# Patient Record
Sex: Male | Born: 1985 | Race: White | Hispanic: No | Marital: Single | Smoking: Never smoker
Health system: Southern US, Community
[De-identification: ages and names within clinical notes are randomized; demographics above are authoritative.]

## PROBLEM LIST (undated history)

## (undated) ENCOUNTER — Emergency Department (HOSPITAL_COMMUNITY): Discharge status: Against Medical Advice | Payer: Self-pay

---

## 2009-05-24 ENCOUNTER — Emergency Department (HOSPITAL_COMMUNITY): Admission: EM | Admit: 2009-05-24 | Discharge: 2009-05-24 | Payer: Self-pay | Admitting: Emergency Medicine

## 2011-12-20 ENCOUNTER — Emergency Department (INDEPENDENT_AMBULATORY_CARE_PROVIDER_SITE_OTHER)
Admission: EM | Admit: 2011-12-20 | Discharge: 2011-12-20 | Disposition: A | Discharge status: Home / Self Care | Payer: Worker's Compensation | Source: Home / Self Care | Attending: Emergency Medicine | Admitting: Emergency Medicine

## 2011-12-20 ENCOUNTER — Encounter (HOSPITAL_COMMUNITY): Payer: Self-pay

## 2011-12-20 ENCOUNTER — Emergency Department (INDEPENDENT_AMBULATORY_CARE_PROVIDER_SITE_OTHER): Payer: Worker's Compensation

## 2011-12-20 DIAGNOSIS — S4350XA Sprain of unspecified acromioclavicular joint, initial encounter: Secondary | ICD-10-CM

## 2011-12-20 DIAGNOSIS — S60219A Contusion of unspecified wrist, initial encounter: Secondary | ICD-10-CM

## 2011-12-20 DIAGNOSIS — S60211A Contusion of right wrist, initial encounter: Secondary | ICD-10-CM

## 2011-12-20 MED ORDER — HYDROCODONE-IBUPROFEN 7.5-200 MG PO TABS: 1.0000 | ORAL_TABLET | Freq: Three times a day (TID) | ORAL | Status: AC | PRN
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
## 2013-10-05 ENCOUNTER — Emergency Department (INDEPENDENT_AMBULATORY_CARE_PROVIDER_SITE_OTHER)
Admission: EM | Admit: 2013-10-05 | Discharge: 2013-10-05 | Disposition: A | Discharge status: Home / Self Care | Payer: PRIVATE HEALTH INSURANCE | Source: Home / Self Care | Attending: Family Medicine | Admitting: Family Medicine

## 2013-10-05 ENCOUNTER — Encounter (HOSPITAL_COMMUNITY): Payer: Self-pay | Admitting: Emergency Medicine

## 2013-10-05 ENCOUNTER — Emergency Department (INDEPENDENT_AMBULATORY_CARE_PROVIDER_SITE_OTHER): Payer: PRIVATE HEALTH INSURANCE

## 2013-10-05 DIAGNOSIS — S93402A Sprain of unspecified ligament of left ankle, initial encounter: Secondary | ICD-10-CM

## 2013-10-05 DIAGNOSIS — Y9302 Activity, running: Secondary | ICD-10-CM

## 2013-10-05 DIAGNOSIS — S93409A Sprain of unspecified ligament of unspecified ankle, initial encounter: Secondary | ICD-10-CM

## 2013-10-05 DIAGNOSIS — S93609A Unspecified sprain of unspecified foot, initial encounter: Secondary | ICD-10-CM

## 2013-10-05 DIAGNOSIS — X500XXA Overexertion from strenuous movement or load, initial encounter: Secondary | ICD-10-CM

## 2013-10-05 DIAGNOSIS — S93602A Unspecified sprain of left foot, initial encounter: Secondary | ICD-10-CM

## 2013-10-05 NOTE — ED Provider Notes (Signed)
CSN: [id number]     Arrival date & time 10/05/13  0913 History   First MD Initiated Contact with Patient 10/05/13 0940     Chief Complaint  Patient presents with  . Ankle Injury   Patient is a 28 y.o. male presenting with lower extremity injury.  Ankle Injury This is a new problem. The current episode started 12 to 24 hours ago. The problem has been gradually worsening. The symptoms are aggravated by walking. Nothing relieves the symptoms. He has tried a cold compress for the symptoms. The treatment provided no relief.  Pt reports he "rolled" his ankle last night while running from a paved surface to a grassy surface. Reports persist pain to the (L) dorsal region of his (L) foot. Pain worse w/ weight bearing.   History reviewed. No pertinent past medical history. History reviewed. No pertinent past surgical history. History reviewed. No pertinent family history. History  Substance Use Topics  . Smoking status: Never Smoker   . Smokeless tobacco: Not on file  . Alcohol Use: Yes    Review of Systems  All other systems reviewed and are negative.   Allergies  Review of patient's allergies indicates no known allergies.  Home Medications   Prior to Admission medications   Not on File   BP 133/94  Pulse 89  Temp(Src) 98.4 F (36.9 C) (Oral)  Resp 20  SpO2 98% Physical Exam  Nursing note and vitals reviewed. Constitutional: He is oriented to person, place, and time. He appears well-developed and well-nourished.  HENT:  Head: Normocephalic and atraumatic.  Eyes: Conjunctivae are normal.  Cardiovascular: Normal rate.   Pulmonary/Chest: Effort normal.  Musculoskeletal:       Feet:  Neurological: He is alert and oriented to person, place, and time.  Skin: Skin is warm and dry.  Psychiatric: He has a normal mood and affect.    ED Course  Procedures (including critical care time) Labs Review Labs Reviewed - No data to display  Imaging Review Dg Foot Complete  Left  10/05/2013   CLINICAL DATA:  History injury to foot/ankle 1 day ago. Pain over lateral tarsals.  EXAM: LEFT FOOT - COMPLETE 3+ VIEW  COMPARISON:  None.  FINDINGS: No acute fracture or dislocation. Presumed accessory ossicle about the medial aspect of the first metatarsal head on the oblique image.  IMPRESSION: No acute osseous abnormality.   Electronically Signed   By: Jeronimo Greaves M.D.   On: 10/05/2013 10:17     MDM   1. Left ankle sprain   2. Sprain of foot, left    Imaging neg for fx. Will treat for sprain.  Lace up ankle support applied. Ibuprofen for pain. RICE instructions discussed and provided in print.   Ortho f/u if not improving.  Leanne Chang, NP 10/05/13 [phone]

## 2013-10-05 NOTE — ED Provider Notes (Signed)
Medical screening examination/treatment/procedure(s) were performed by a resident physician or non-physician practitioner and as the supervising physician I was immediately available for consultation/collaboration.  Clementeen Graham, MD   Rodolph Bong, MD 10/05/13 [phone]

## 2013-10-05 NOTE — Discharge Instructions (Signed)
Your xrays today are negative for broken bones. Take Ibuprofen 800 mg three times a day for 3-5 days. Wear the wrap until pain improves or resolves. If your symptoms persist please arrange follow up with Dr Eulah Pont for further evaluation.  Ankle Sprain An ankle sprain is an injury to the strong, fibrous tissues (ligaments) that hold your ankle bones together.  HOME CARE   Put ice on your ankle for 1 2 days or as told by your doctor.  Put ice in a plastic bag.  Place a towel between your skin and the bag.  Leave the ice on for 15-20 minutes at a time, every 2 hours while you are awake.  Only take medicine as told by your doctor.  Raise (elevate) your injured ankle above the level of your heart as much as possible for 2 3 days.  Use crutches if your doctor tells you to. Slowly put your own weight on the affected ankle. Use the crutches until you can walk without pain.  If you have a plaster splint:  Do not rest it on anything harder than a pillow for 24 hours.  Do not put weight on it.  Do not get it wet.  Take it off to shower or bathe.  If given, use an elastic wrap or support stocking for support. Take the wrap off if your toes lose feeling (numb), tingle, or turn cold or blue.  If you have an air splint:  Add or let out air to make it comfortable.  Take it off at night and to shower and bathe.  Wiggle your toes and move your ankle up and down often while you are wearing it. GET HELP RIGHT AWAY IF:   Your toes lose feeling (numb) or turn blue.  You have severe pain that is increasing.  You have rapidly increasing bruising or puffiness (swelling).  Your toes feel very cold.  You lose feeling in your foot.  Your medicine does not help your pain. MAKE SURE YOU:   Understand these instructions.  Will watch your condition.  Will get help right away if you are not doing well or get worse. Document Released: 10/04/2007 Document Revised: 01/10/2012 Document  Reviewed: 10/30/2011 [facility] Patient Information 2014 [place], [place].  Foot Sprain The muscles and cord like structures which attach muscle to bone (tendons) that surround the feet are made up of units. A foot sprain can occur at the weakest spot in any of these units. This condition is most often caused by injury to or overuse of the foot, as from playing contact sports, or aggravating a previous injury, or from poor conditioning, or obesity. SYMPTOMS  Pain with movement of the foot.  Tenderness and swelling at the injury site.  Loss of strength is present in moderate or severe sprains. THE THREE GRADES OR SEVERITY OF FOOT SPRAIN ARE:  Mild (Grade I): Slightly pulled muscle without tearing of muscle or tendon fibers or loss of strength.  Moderate (Grade II): Tearing of fibers in a muscle, tendon, or at the attachment to bone, with small decrease in strength.  Severe (Grade III): Rupture of the muscle-tendon-bone attachment, with separation of fibers. Severe sprain requires surgical repair. Often repeating (chronic) sprains are caused by overuse. Sudden (acute) sprains are caused by direct injury or over-use. DIAGNOSIS  Diagnosis of this condition is usually by your own observation. If problems continue, a caregiver may be required for further evaluation and treatment. X-rays may be required to make sure there are not  breaks in the bones (fractures) present. Continued problems may require physical therapy for treatment. PREVENTION  Use strength and conditioning exercises appropriate for your sport.  Warm up properly prior to working out.  Use athletic shoes that are made for the sport you are participating in.  Allow adequate time for healing. Early return to activities makes repeat injury more likely, and can lead to an unstable arthritic foot that can result in prolonged disability. Mild sprains generally heal in 3 to 10 days, with moderate and severe sprains taking 2 to 10 weeks.  Your caregiver can help you determine the proper time required for healing. HOME CARE INSTRUCTIONS   Apply ice to the injury for 15-20 minutes, 03-04 times per day. Put the ice in a plastic bag and place a towel between the bag of ice and your skin.  An elastic wrap (like an Ace bandage) may be used to keep swelling down.  Keep foot above the level of the heart, or at least raised on a footstool, when swelling and pain are present.  Try to avoid use other than gentle range of motion while the foot is painful. Do not resume use until instructed by your caregiver. Then begin use gradually, not increasing use to the point of pain. If pain does develop, decrease use and continue the above measures, gradually increasing activities that do not cause discomfort, until you gradually achieve normal use.  Use crutches if and as instructed, and for the length of time instructed.  Keep injured foot and ankle wrapped between treatments.  Massage foot and ankle for comfort and to keep swelling down. Massage from the toes up towards the knee.  Only take over-the-counter or prescription medicines for pain, discomfort, or fever as directed by your caregiver. SEEK IMMEDIATE MEDICAL CARE IF:   Your pain and swelling increase, or pain is not controlled with medications.  You have loss of feeling in your foot or your foot turns cold or blue.  You develop new, unexplained symptoms, or an increase of the symptoms that brought you to your caregiver. MAKE SURE YOU:   Understand these instructions.  Will watch your condition.  Will get help right away if you are not doing well or get worse. Document Released: 10/07/2001 Document Revised: 07/10/2011 Document Reviewed: 12/05/2007 [facility] Patient Information 2014 [place].

## 2013-10-05 NOTE — ED Notes (Signed)
Rolled his left ankle last PM when stepped onto an uneven surface while crossing the street. NAD. C/o pain worse today in spite of ice, elevate. Had taken no medication for this problem

## 2015-08-16 IMAGING — CR DG FOOT COMPLETE 3+V*L*
3 series · 3 of 3 positions shown · non-contrast
Comparison: None.

CLINICAL DATA: History injury to foot/ankle 1 day ago. Pain over
lateral tarsals.

EXAM:
LEFT FOOT - COMPLETE 3+ VIEW

[view not recorded (1 of 3)]
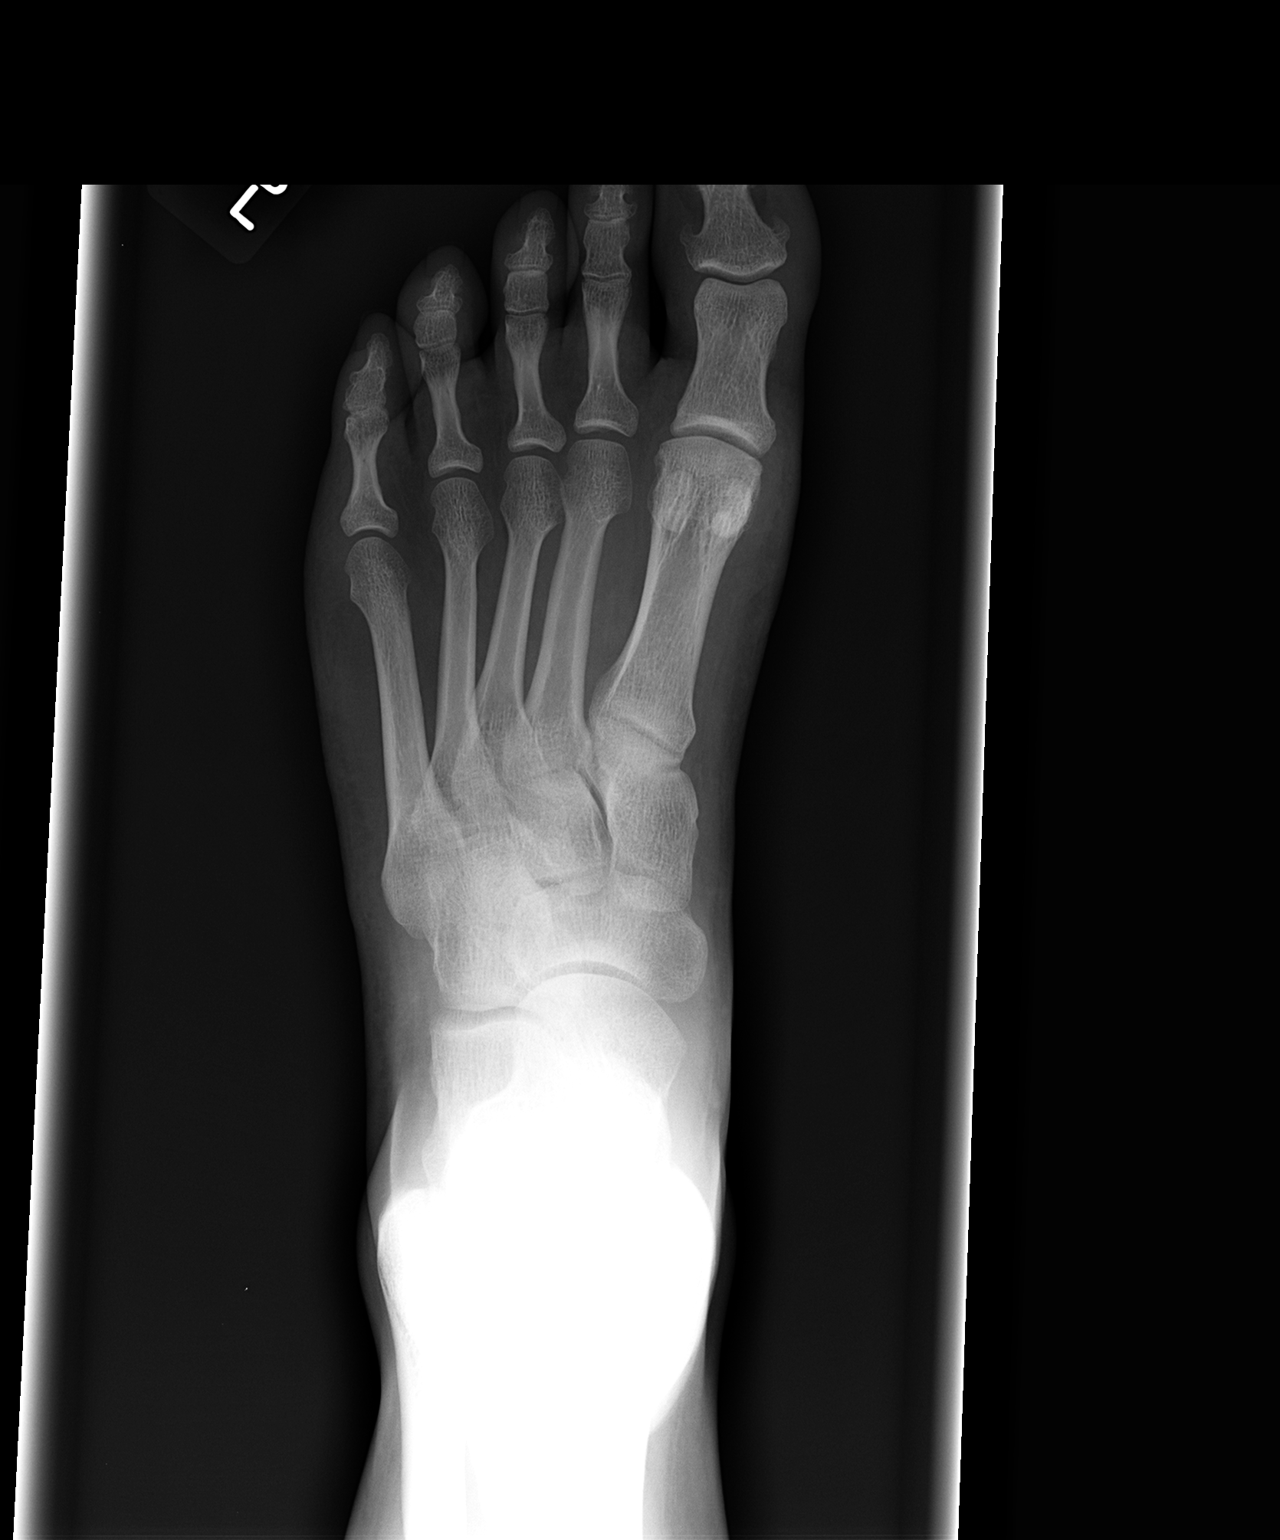

[view not recorded (2 of 3)]
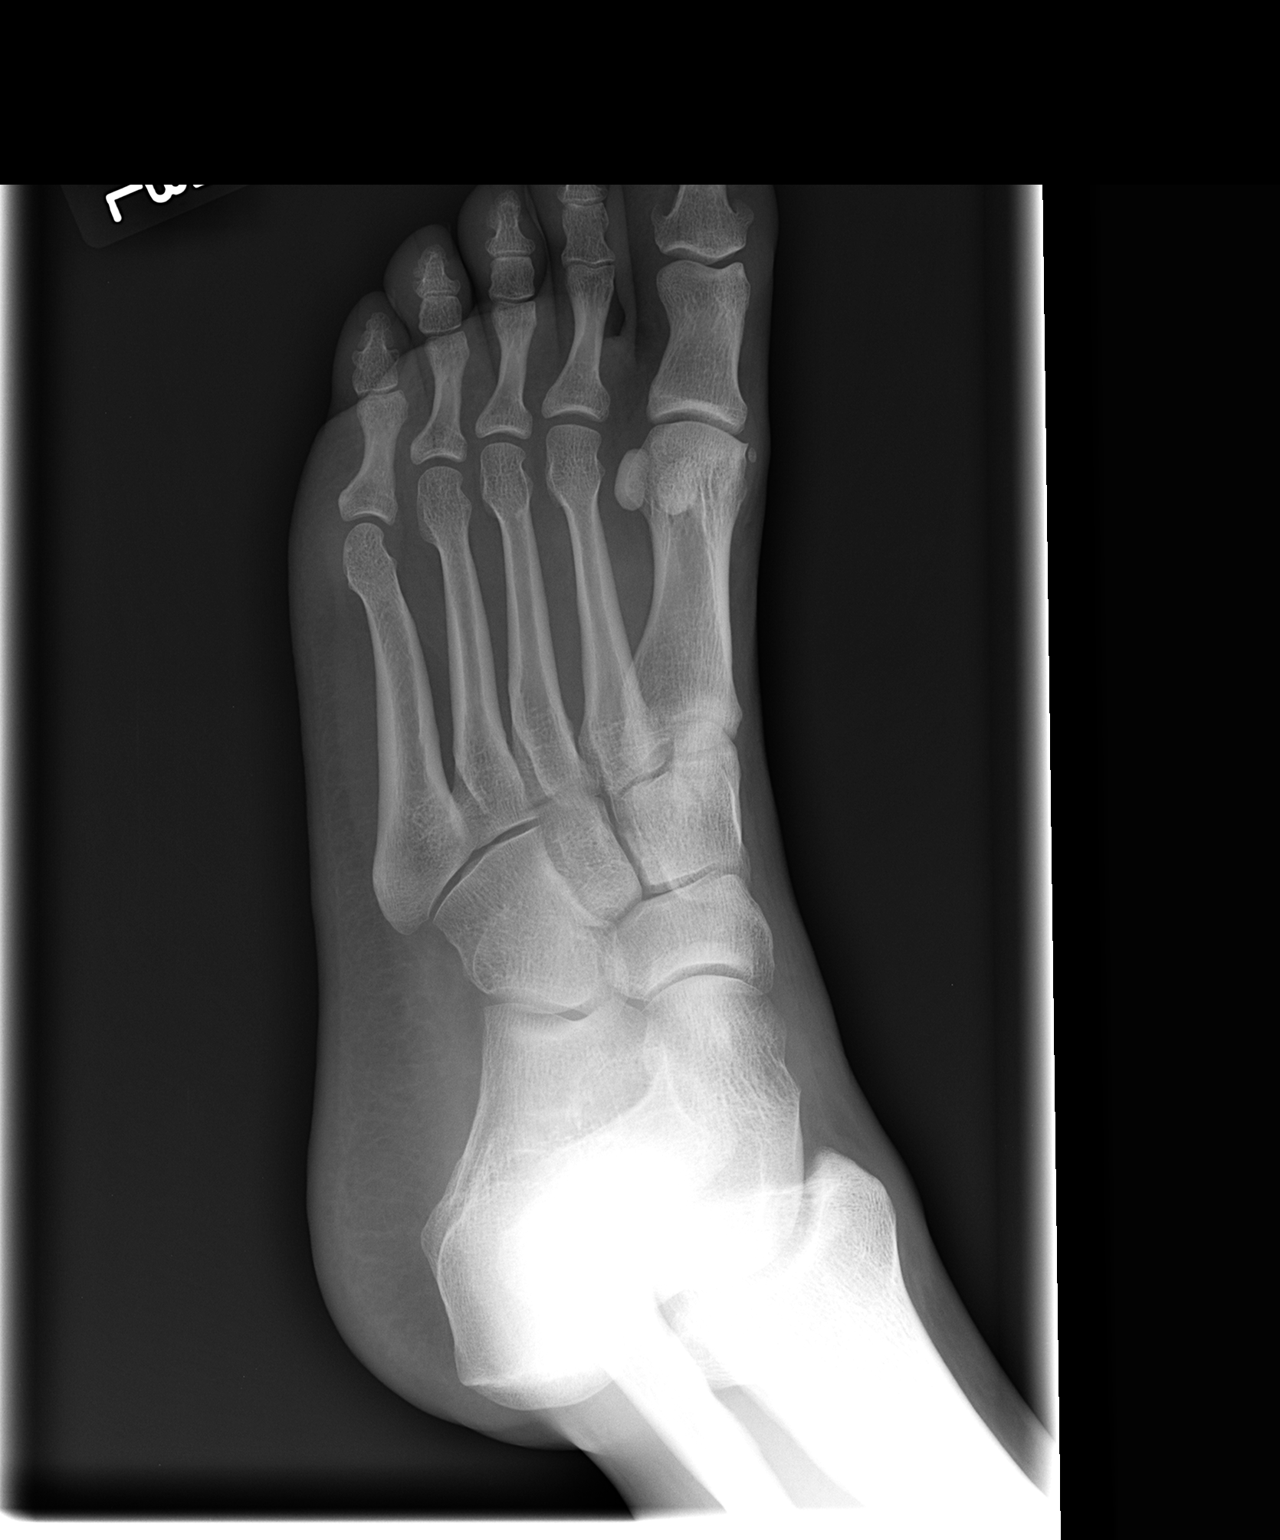

[view not recorded (3 of 3)]
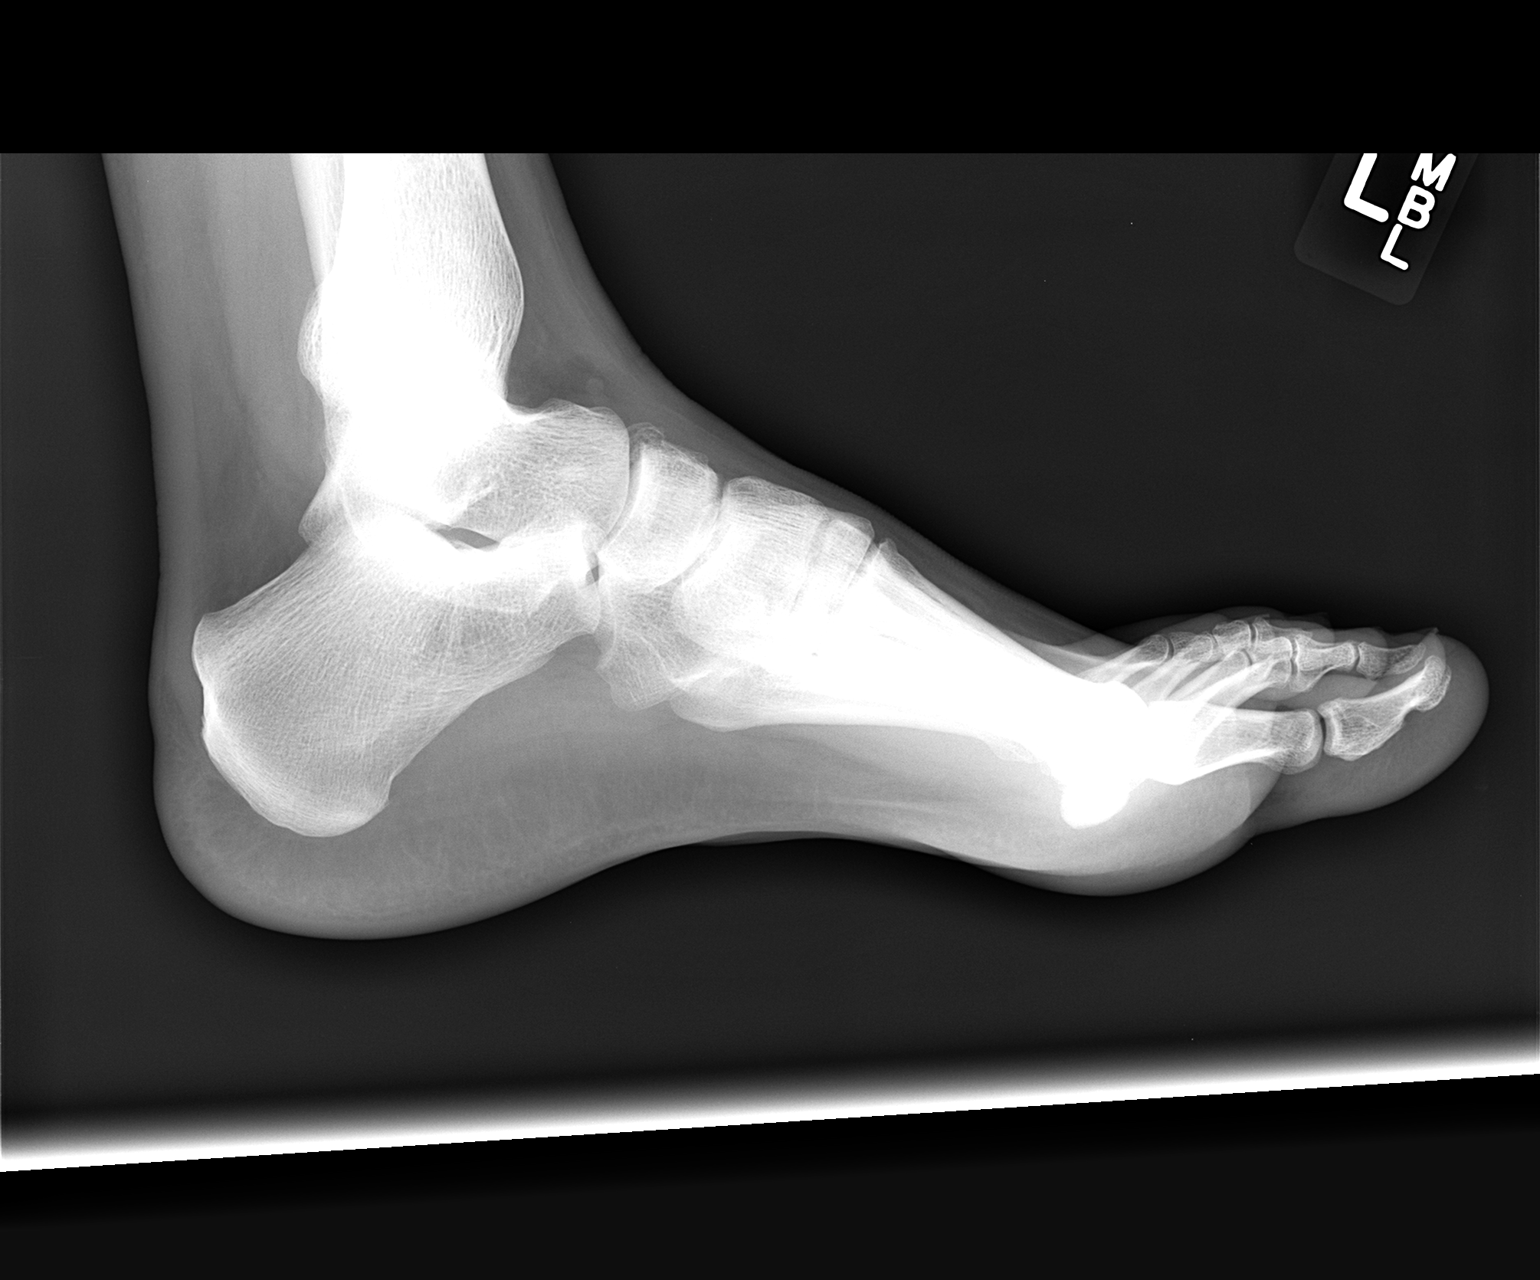

[3 of 3 positions shown; findings below may reference images not displayed]

FINDINGS: No acute fracture or dislocation. Presumed accessory ossicle about
the medial aspect of the first metatarsal head on the oblique image.
IMPRESSION: No acute osseous abnormality.

## 2015-08-22 ENCOUNTER — Ambulatory Visit (INDEPENDENT_AMBULATORY_CARE_PROVIDER_SITE_OTHER): Payer: PRIVATE HEALTH INSURANCE | Admitting: Internal Medicine

## 2015-08-22 VITALS — BP 122/80 | HR 71 | Temp 98.1°F | Resp 18 | Ht 73.0 in | Wt 218.0 lb

## 2015-08-22 DIAGNOSIS — L089 Local infection of the skin and subcutaneous tissue, unspecified: Secondary | ICD-10-CM

## 2015-08-22 DIAGNOSIS — H6012 Cellulitis of left external ear: Secondary | ICD-10-CM

## 2015-08-22 DIAGNOSIS — L0889 Other specified local infections of the skin and subcutaneous tissue: Secondary | ICD-10-CM

## 2015-08-22 MED ORDER — CLINDAMYCIN HCL 300 MG PO CAPS: 300.0000 mg | ORAL_CAPSULE | Freq: Three times a day (TID) | ORAL | Status: AC

## 2015-08-22 NOTE — Patient Instructions (Signed)
     IF you received an x-ray today, you will receive an invoice from [facility]. Please contact [facility] at [phone] with questions or concerns regarding your invoice.   IF you received labwork today, you will receive an invoice from [facility]. Please contact [facility] at [phone] with questions or concerns regarding your invoice.   Our billing staff will not be able to assist you with questions regarding bills from these companies.  You will be contacted with the lab results as soon as they are available. The fastest way to get your results is to activate your My Chart account. Instructions are located on the last page of this paperwork. If you have not heard from us regarding the results in 2 weeks, please contact this office.      

## 2015-08-22 NOTE — Progress Notes (Signed)
   Subjective:  By signing my name below, I, Stann Oresung-Kai Tsai, attest that this documentation has been prepared under the direction and in the presence of Ellamae Siaobert Gregorey Nabor, MD. Electronically Signed: Stann Oresung-Kai Tsai, Scribe. 08/22/2015 , 9:25 AM .  Patient was seen in Room 9 .   Patient ID: Kent PicketDavid Robotham, male    DOB: Jul 29, 1985, 30 y.o.   MRN: [id number] Chief Complaint  Patient presents with  . Ear Pain    left/x 2 days  . Jaw Pain    x 2 days/ left   HPI Kent Quinn is a 30 y.o. male who [facility] complaining of left ear pain and left jaw pain ongoing for about 5-6 days. He notes the pain worsened last night. He states he has seasonal allergies and would take claritin and zyrtec for it.   There are no active problems to display for this patient.   Current outpatient prescriptions:  .  clindamycin (CLEOCIN) 300 MG capsule, Take 1 capsule (300 mg total) by mouth 3 (three) times daily., Disp: 30 capsule, Rfl: 0 No Known Allergies  Review of Systems  Constitutional: Negative for fever, chills and fatigue.  HENT: Positive for ear pain. Negative for congestion, ear discharge, hearing loss, rhinorrhea, sneezing and sore throat.   Gastrointestinal: Negative for nausea and vomiting.  Allergic/Immunologic: Positive for environmental allergies.  Hematological: Negative for adenopathy.      Objective:   Physical Exam  Constitutional: He is oriented to person, place, and time. He appears well-developed and well-nourished. No distress.  HENT:  Head: Normocephalic and atraumatic.  Left Ear: Hearing normal.  Mouth/Throat: No oral lesions. No trismus in the jaw. No dental abscesses. No oropharyngeal exudate.  Slight fluid bulge left tm but no redness, 2cm into the canal is a pustule with redness and tenderness to touch, this reproduces the pain he has been feeling, no pain with tragus pull  Eyes: EOM are normal. Pupils are equal, round, and reactive to light.  Neck: Neck supple.    Cardiovascular: Normal rate.   Pulmonary/Chest: Effort normal. No respiratory distress.  Musculoskeletal: Normal range of motion.  Lymphadenopathy:       Head (left side): No submandibular, no preauricular and no posterior auricular adenopathy present.       Left cervical: No superficial cervical, no deep cervical and no posterior cervical adenopathy present.  Neurological: He is alert and oriented to person, place, and time.  Skin: Skin is warm and dry.  Psychiatric: He has a normal mood and affect. His behavior is normal.  Nursing note and vitals reviewed.  BP 122/80 mmHg  Pulse 71  Temp(Src) 98.1 F (36.7 C) (Oral)  Resp 18  Ht 6\' 1"  (1.854 m)  Wt 218 lb (98.884 kg)  BMI 28.77 kg/m2  SpO2 98%    Assessment & Plan:  I have completed the patient encounter in its entirety as documented by the scribe, with editing by me where necessary. Raphael Espe P. Merla Richesoolittle, M.D. Pustule  Cellulitis of ear canal, left  Meds ordered this encounter  Medications  . clindamycin (CLEOCIN) 300 MG capsule    Sig: Take 1 capsule (300 mg total) by mouth 3 (three) times daily.    Dispense:  30 capsule    Refill:  0  Heating pad 10-15 minutes 3-4 times a day Expect pus discharge If worse will need ENT referral for I&D and
